# Patient Record
Sex: Male | Born: 1960 | Race: White | Hispanic: No | Marital: Married | Smoking: Never smoker
Health system: Southern US, Community
[De-identification: ages and names within clinical notes are randomized; demographics above are authoritative.]

## PROBLEM LIST (undated history)

## (undated) DIAGNOSIS — D3500 Benign neoplasm of unspecified adrenal gland: Secondary | ICD-10-CM

## (undated) DIAGNOSIS — N402 Nodular prostate without lower urinary tract symptoms: Secondary | ICD-10-CM

## (undated) DIAGNOSIS — I10 Essential (primary) hypertension: Secondary | ICD-10-CM

## (undated) DIAGNOSIS — G4733 Obstructive sleep apnea (adult) (pediatric): Secondary | ICD-10-CM

## (undated) DIAGNOSIS — N200 Calculus of kidney: Secondary | ICD-10-CM

## (undated) HISTORY — PX: KNEE SURGERY: SHX244

---

## 2015-05-17 ENCOUNTER — Emergency Department (HOSPITAL_BASED_OUTPATIENT_CLINIC_OR_DEPARTMENT_OTHER)
Admission: EM | Admit: 2015-05-17 | Discharge: 2015-05-17 | Disposition: A | Discharge status: Home / Self Care | Payer: Worker's Compensation | Attending: Emergency Medicine | Admitting: Emergency Medicine

## 2015-05-17 ENCOUNTER — Emergency Department (HOSPITAL_BASED_OUTPATIENT_CLINIC_OR_DEPARTMENT_OTHER): Payer: Worker's Compensation

## 2015-05-17 ENCOUNTER — Encounter (HOSPITAL_BASED_OUTPATIENT_CLINIC_OR_DEPARTMENT_OTHER): Payer: Self-pay

## 2015-05-17 DIAGNOSIS — Y9289 Other specified places as the place of occurrence of the external cause: Secondary | ICD-10-CM | POA: Insufficient documentation

## 2015-05-17 DIAGNOSIS — Y990 Civilian activity done for income or pay: Secondary | ICD-10-CM | POA: Diagnosis not present

## 2015-05-17 DIAGNOSIS — Y9389 Activity, other specified: Secondary | ICD-10-CM | POA: Diagnosis not present

## 2015-05-17 DIAGNOSIS — S29001A Unspecified injury of muscle and tendon of front wall of thorax, initial encounter: Secondary | ICD-10-CM | POA: Diagnosis not present

## 2015-05-17 DIAGNOSIS — W172XXA Fall into hole, initial encounter: Secondary | ICD-10-CM | POA: Insufficient documentation

## 2015-05-17 DIAGNOSIS — R0781 Pleurodynia: Secondary | ICD-10-CM

## 2015-05-17 DIAGNOSIS — R1011 Right upper quadrant pain: Secondary | ICD-10-CM

## 2015-05-17 DIAGNOSIS — W19XXXA Unspecified fall, initial encounter: Secondary | ICD-10-CM

## 2015-05-17 DIAGNOSIS — N201 Calculus of ureter: Secondary | ICD-10-CM

## 2015-05-17 DIAGNOSIS — E278 Other specified disorders of adrenal gland: Secondary | ICD-10-CM | POA: Diagnosis not present

## 2015-05-17 DIAGNOSIS — R0789 Other chest pain: Secondary | ICD-10-CM

## 2015-05-17 LAB — CBC WITH DIFFERENTIAL/PLATELET
BASOS ABS: 0 10*3/uL (ref 0.0–0.1)
BASOS PCT: 0 %
Eosinophils Absolute: 0.1 10*3/uL (ref 0.0–0.7)
Eosinophils Relative: 1 %
HEMATOCRIT: 45.4 % (ref 39.0–52.0)
HEMOGLOBIN: 15.1 g/dL (ref 13.0–17.0)
Lymphocytes Relative: 20 %
Lymphs Abs: 1.7 10*3/uL (ref 0.7–4.0)
MCH: 28.1 pg (ref 26.0–34.0)
MCHC: 33.3 g/dL (ref 30.0–36.0)
MCV: 84.4 fL (ref 78.0–100.0)
Monocytes Absolute: 0.6 10*3/uL (ref 0.1–1.0)
Monocytes Relative: 7 %
NEUTROS ABS: 6.3 10*3/uL (ref 1.7–7.7)
NEUTROS PCT: 72 %
PLATELETS: 205 10*3/uL (ref 150–400)
RBC: 5.38 MIL/uL (ref 4.22–5.81)
RDW: 13.5 % (ref 11.5–15.5)
WBC: 8.7 10*3/uL (ref 4.0–10.5)

## 2015-05-17 LAB — COMPREHENSIVE METABOLIC PANEL
ALK PHOS: 77 U/L (ref 38–126)
ALT: 35 U/L (ref 17–63)
ANION GAP: 5 (ref 5–15)
AST: 34 U/L (ref 15–41)
Albumin: 4.2 g/dL (ref 3.5–5.0)
BILIRUBIN TOTAL: 0.6 mg/dL (ref 0.3–1.2)
BUN: 16 mg/dL (ref 6–20)
CALCIUM: 8.5 mg/dL — AB (ref 8.9–10.3)
CO2: 28 mmol/L (ref 22–32)
Chloride: 104 mmol/L (ref 101–111)
Creatinine, Ser: 1.21 mg/dL (ref 0.61–1.24)
GFR calc non Af Amer: >60 mL/min (ref >60)
Glucose, Bld: 127 mg/dL — ABNORMAL HIGH (ref 65–99)
POTASSIUM: 4.2 mmol/L (ref 3.5–5.1)
Sodium: 137 mmol/L (ref 135–145)
TOTAL PROTEIN: 6.9 g/dL (ref 6.5–8.1)

## 2015-05-17 MED ORDER — OXYCODONE-ACETAMINOPHEN 5-325 MG PO TABS: 1.0000 | ORAL_TABLET | Freq: Three times a day (TID) | ORAL | Status: AC | PRN

## 2015-05-17 MED ORDER — OXYCODONE-ACETAMINOPHEN 5-325 MG PO TABS
2.0000 | ORAL_TABLET | Freq: Once | ORAL | Status: AC
  Administered 2015-05-17: 2 via ORAL
  Filled 2015-05-17: qty 2

## 2015-05-17 MED ORDER — IOHEXOL 300 MG/ML  SOLN
100.0000 mL | Freq: Once | INTRAMUSCULAR | Status: AC | PRN
  Administered 2015-05-17: 100 mL via INTRAVENOUS

## 2015-05-17 NOTE — Discharge Instructions (Signed)
Chest Wall Pain Chest wall pain is pain in or around the bones and muscles of your chest. Sometimes, an injury causes this pain. Sometimes, the cause may not be known. This pain may take several weeks or longer to get better. HOME CARE INSTRUCTIONS  Pay attention to any changes in your symptoms. Take these actions to help with your pain:   Rest as told by your health care provider.   Avoid activities that cause pain. These include any activities that use your chest muscles or your abdominal and side muscles to lift heavy items.   If directed, apply ice to the painful area:  Put ice in a plastic bag.  Place a towel between your skin and the bag.  Leave the ice on for 20 minutes, 2-3 times per day.  Take over-the-counter and prescription medicines only as told by your health care provider.  Do not use tobacco products, including cigarettes, chewing tobacco, and e-cigarettes. If you need help quitting, ask your health care provider.  Keep all follow-up visits as told by your health care provider. This is important. SEEK MEDICAL CARE IF:  You have a fever.  Your chest pain becomes worse.  You have new symptoms. SEEK IMMEDIATE MEDICAL CARE IF:  You have nausea or vomiting.  You feel sweaty or light-headed.  You have a cough with phlegm (sputum) or you cough up blood.  You develop shortness of breath.   This information is not intended to replace advice given to you by your health care provider. Make sure you discuss any questions you have with your health care provider.   Document Released: 06/04/2005 Document Revised: 02/23/2015 Document Reviewed: 08/30/2014 Elsevier Interactive Patient Education 2016 [place].  Kidney Stones Kidney stones (urolithiasis) are deposits that form inside your kidneys. The intense pain is caused by the stone moving through the urinary tract. When the stone moves, the ureter goes into spasm around the stone. The stone is usually passed in  the urine.  CAUSES   A disorder that makes certain neck glands produce too much parathyroid hormone (primary hyperparathyroidism).  A buildup of uric acid crystals, similar to gout in your joints.  Narrowing (stricture) of the ureter.  A kidney obstruction present at birth (congenital obstruction).  Previous surgery on the kidney or ureters.  Numerous kidney infections. SYMPTOMS   Feeling sick to your stomach (nauseous).  Throwing up (vomiting).  Blood in the urine (hematuria).  Pain that usually spreads (radiates) to the groin.  Frequency or urgency of urination. DIAGNOSIS   Taking a history and physical exam.  Blood or urine tests.  CT scan.  Occasionally, an examination of the inside of the urinary bladder (cystoscopy) is performed. TREATMENT   Observation.  Increasing your fluid intake.  Extracorporeal shock wave lithotripsy--This is a noninvasive procedure that uses shock waves to break up kidney stones.  Surgery may be needed if you have severe pain or persistent obstruction. There are various surgical procedures. Most of the procedures are performed with the use of small instruments. Only small incisions are needed to accommodate these instruments, so recovery time is minimized. The size, location, and chemical composition are all important variables that will determine the proper choice of action for you. Talk to your health care provider to better understand your situation so that you will minimize the risk of injury to yourself and your kidney.  HOME CARE INSTRUCTIONS   Drink enough water and fluids to keep your urine clear or pale yellow. This will  help you to pass the stone or stone fragments.  Strain all urine through the provided strainer. Keep all particulate matter and stones for your health care provider to see. The stone causing the pain may be as small as a grain of salt. It is very important to use the strainer each and every time you pass your  urine. The collection of your stone will allow your health care provider to analyze it and verify that a stone has actually passed. The stone analysis will often identify what you can do to reduce the incidence of recurrences.  Only take over-the-counter or prescription medicines for pain, discomfort, or fever as directed by your health care provider.  Keep all follow-up visits as told by your health care provider. This is important.  Get follow-up X-rays if required. The absence of pain does not always mean that the stone has passed. It may have only stopped moving. If the urine remains completely obstructed, it can cause loss of kidney function or even complete destruction of the kidney. It is your responsibility to make sure X-rays and follow-ups are completed. Ultrasounds of the kidney can show blockages and the status of the kidney. Ultrasounds are not associated with any radiation and can be performed easily in a matter of minutes.  Make changes to your daily diet as told by your health care provider. You may be told to:  Limit the amount of salt that you eat.  Eat 5 or more servings of fruits and vegetables each day.  Limit the amount of meat, poultry, fish, and eggs that you eat.  Collect a 24-hour urine sample as told by your health care provider.You may need to collect another urine sample every 6-12 months. SEEK MEDICAL CARE IF:  You experience pain that is progressive and unresponsive to any pain medicine you have been prescribed. SEEK IMMEDIATE MEDICAL CARE IF:   Pain cannot be controlled with the prescribed medicine.  You have a fever or shaking chills.  The severity or intensity of pain increases over 18 hours and is not relieved by pain medicine.  You develop a new onset of abdominal pain.  You feel faint or pass out.  You are unable to urinate.   This information is not intended to replace advice given to you by your health care provider. Make sure you discuss any  questions you have with your health care provider.   Document Released: 06/04/2005 Document Revised: 02/23/2015 Document Reviewed: 11/05/2012 Elsevier Interactive Patient Education [employer].

## 2015-05-17 NOTE — ED Provider Notes (Signed)
CSN: [id number]     Arrival date & time 05/17/15  1353 History   First MD Initiated Contact with Patient 05/17/15 1411     Chief Complaint  Patient presents with  . Fall     (Consider location/radiation/quality/duration/timing/severity/associated sxs/prior Treatment) HPI Comments: Pt states that he feel in a whole and the landed on his right side. Denies loc. States that it hurts to take a deep breath  Patient is a 54 y.o. male presenting with fall. The history is provided by the patient. No language interpreter was used.  Fall This is a new problem. The current episode started today. The problem occurs constantly. The problem has been unchanged. Associated symptoms include chest pain. Pertinent negatives include no abdominal pain, neck pain, numbness or weakness. The symptoms are aggravated by bending. He has tried nothing for the symptoms.    History reviewed. No pertinent past medical history. Past Surgical History  Procedure Laterality Date  . Knee surgery     No family history on file. Social History  Substance Use Topics  . Smoking status: Never Smoker   . Smokeless tobacco: None  . Alcohol Use: No    Review of Systems  Cardiovascular: Positive for chest pain.  Gastrointestinal: Negative for abdominal pain.  Musculoskeletal: Negative for neck pain.  Neurological: Negative for weakness and numbness.  All other systems reviewed and are negative.     Allergies  Sulfa antibiotics  Home Medications   Prior to Admission medications   Medication Sig Start Date End Date Taking? Authorizing Provider  Cetirizine HCl (ZYRTEC ALLERGY PO) Take by mouth.   Yes Historical Provider, MD   BP 188/109 mmHg  Pulse 132  Temp(Src) 98.2 F (36.8 C) (Oral)  Resp 20  Ht 5\' 3"  (1.6 m)  Wt 115.667 kg  BMI 45.18 kg/m2  SpO2 98% Physical Exam  Constitutional: He is oriented to person, place, and time. He appears well-developed and well-nourished.  HENT:  Head: Normocephalic and  atraumatic.  Cardiovascular: Normal rate and regular rhythm.   Pulmonary/Chest: Effort normal and breath sounds normal.  Right anterior and lateral ribs tender to palpation  Abdominal: Soft. Bowel sounds are normal. There is no tenderness.  Musculoskeletal: Normal range of motion.  Neurological: He is alert and oriented to person, place, and time. He exhibits normal muscle tone. Coordination normal.  Skin: Skin is warm and dry.  Psychiatric: He has a normal mood and affect.  Nursing note and vitals reviewed.   ED Course  Procedures (including critical care time) Labs Review Labs Reviewed - No data to display  Imaging Review Dg Ribs Unilateral W/chest Right  05/17/2015  CLINICAL DATA:  [place] one hour ago. Pain in rt lower ant ribs to rt posterior ribs. EXAM: RIGHT RIBS AND CHEST - 3+ VIEW COMPARISON:  None. FINDINGS: No fracture or other bone lesions are seen involving the ribs. There is no evidence of pneumothorax or pleural effusion. Both lungs are clear. Heart size and mediastinal contours are within normal limits. IMPRESSION: Negative. Electronically Signed   By: Skipper Cliche M.D.   On: 05/17/2015 14:32   Ct Abdomen Pelvis W Contrast  05/17/2015  CLINICAL DATA:  54 year old male with history of trauma from a fall into a hole while at work complaining of pain in the right flank. EXAM: CT ABDOMEN AND PELVIS WITH CONTRAST TECHNIQUE: Multidetector CT imaging of the abdomen and pelvis was performed using the standard protocol following bolus administration of intravenous contrast. CONTRAST:  160mL OMNIPAQUE IOHEXOL 300  MG/ML  SOLN COMPARISON:  No priors. FINDINGS: Lower chest:  Unremarkable. Hepatobiliary: No definite cystic or solid hepatic lesions. No intra or extrahepatic biliary ductal dilatation. No signs of acute traumatic injury to the liver. Gallbladder is normal in appearance. Pancreas: No pancreatic mass. No pancreatic ductal dilatation. No pancreatic or peripancreatic fluid or  inflammatory changes. Spleen: No evidence of acute traumatic injury to the spleen. Adrenals/Urinary Tract: No signs of acute traumatic injury to either kidney. 6 mm nonobstructive calculus in the upper pole collecting system of the left kidney. In addition, at the right ureteropelvic junction there is a 7 mm calculus. There is some very mild fullness in the right renal collecting system, with slightly delayed nephrogram and delayed excretion of contrast by the right kidney, indicative of mild obstruction at this time. Kidneys are otherwise unremarkable in appearance. Urinary bladder is normal in appearance. 2.2 x 1.7 cm left adrenal nodule is indeterminate. Right adrenal gland is normal in appearance. Stomach/Bowel: The appearance of the stomach is normal. No pathologic dilatation of small bowel or colon. Normal appendix. Vascular/Lymphatic: No significant atherosclerotic disease, aneurysm or dissection identified in the abdominal or pelvic vasculature. Retroaortic left renal vein (normal anatomical variant) incidentally noted. No lymphadenopathy noted in the abdomen or pelvis. Reproductive: Prostate gland and seminal vesicles are unremarkable in appearance. Other: Tiny umbilical hernia containing only omental fat incidentally noted. No significant volume of ascites. No pneumoperitoneum. Musculoskeletal: No acute displaced fractures or aggressive appearing lytic or blastic lesions are noted in the visualized portions of the skeleton. IMPRESSION: 1. No signs of significant acute traumatic injury to the abdomen or pelvis. 2. 7 mm calculus at the right ureteropelvic junction, with signs of mild right-sided obstruction. Urologic consultation is suggested. 3. 6 mm nonobstructive calculus in the upper pole collecting system of the left kidney. 4. 2.2 x 1.7 cm indeterminate nodule in the left adrenal gland. 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 This could be further characterized with noncontrast CT or  MRI of the abdomen if clinically appropriate. 5. Tiny umbilical hernia containing only omental fat. 6. Normal appendix. Electronically Signed   By: Vinnie Langton M.D.   On: 05/17/2015 15:55   I have personally reviewed and evaluated these images and lab results as part of my medical decision-making.   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)  Right upper quadrant pain  Ureteral stone    Discussed findings of ct scan with pt. Pt given urology follow up. Will given oxycodone for pain. No acute abdominal or chest injury noted    Glendell Docker, NP 05/17/15 [place], MD 05/18/15 [id number]

## 2015-05-17 NOTE — ED Notes (Signed)
Fell in a hole at [employer] to right flank-steady gait-standing thru triage

## 2016-05-06 IMAGING — CR DG RIBS W/ CHEST 3+V*R*
4 series · 4 of 4 positions shown · non-contrast
Comparison: None.

CLINICAL DATA: Fell one hour ago. Pain in rt lower ant ribs to rt
posterior ribs.

EXAM:
RIGHT RIBS AND CHEST - 3+ VIEW

[w chest pa]
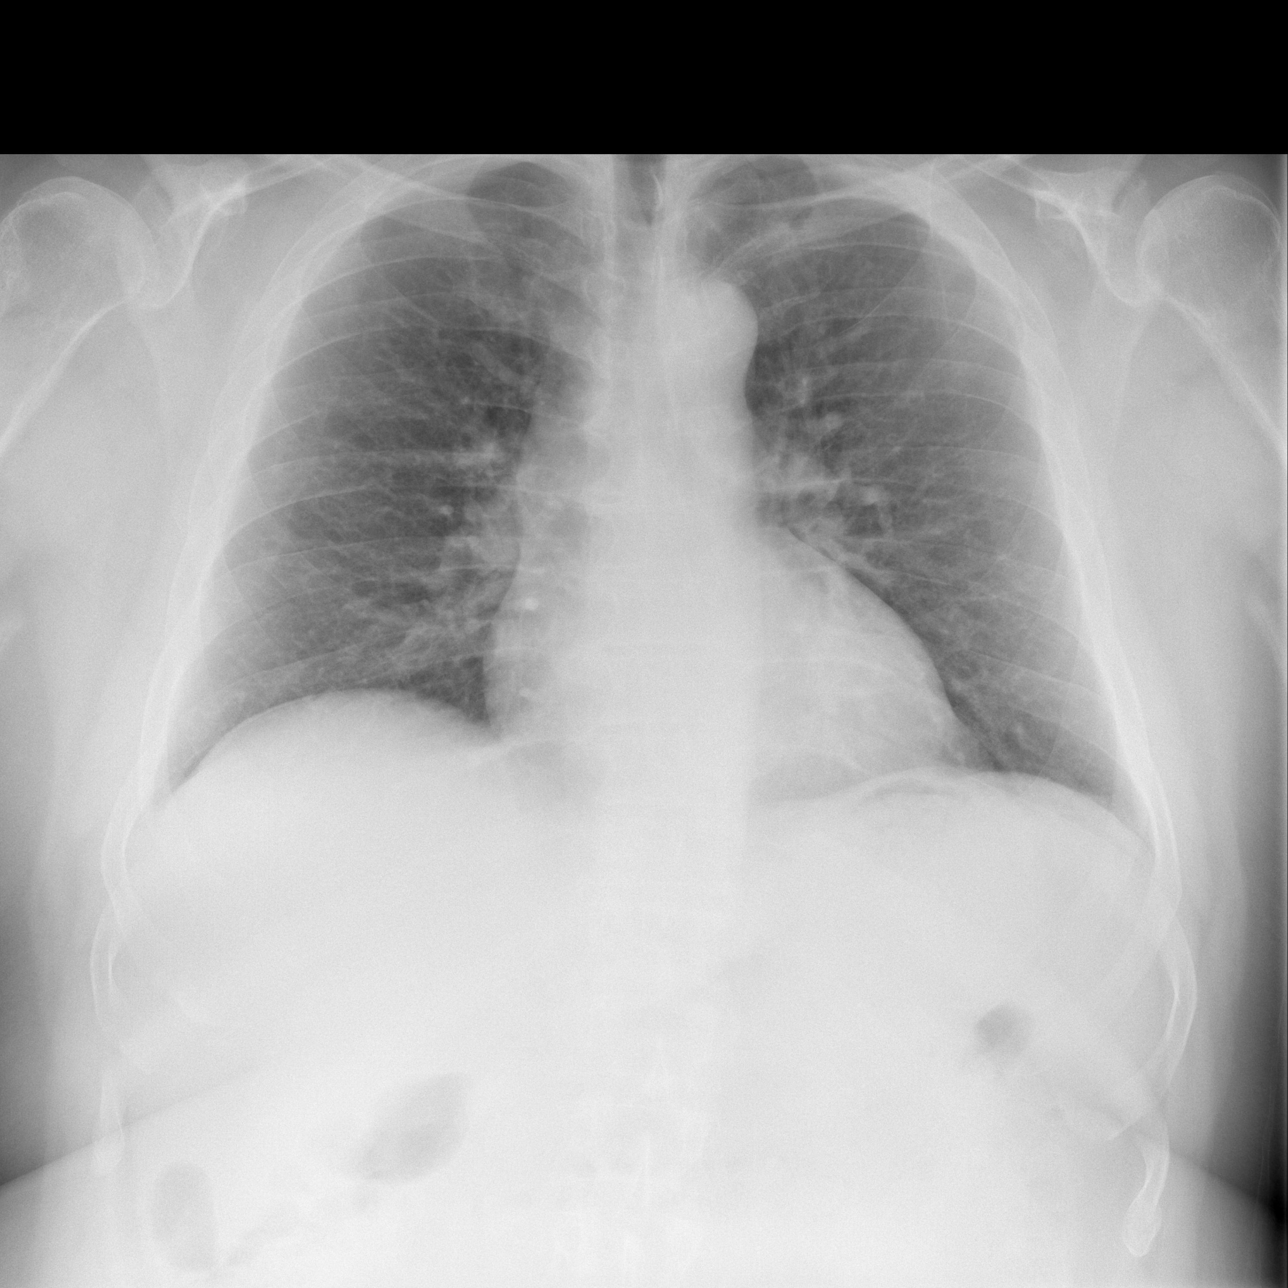

[w ribs ap/pa upper right *]
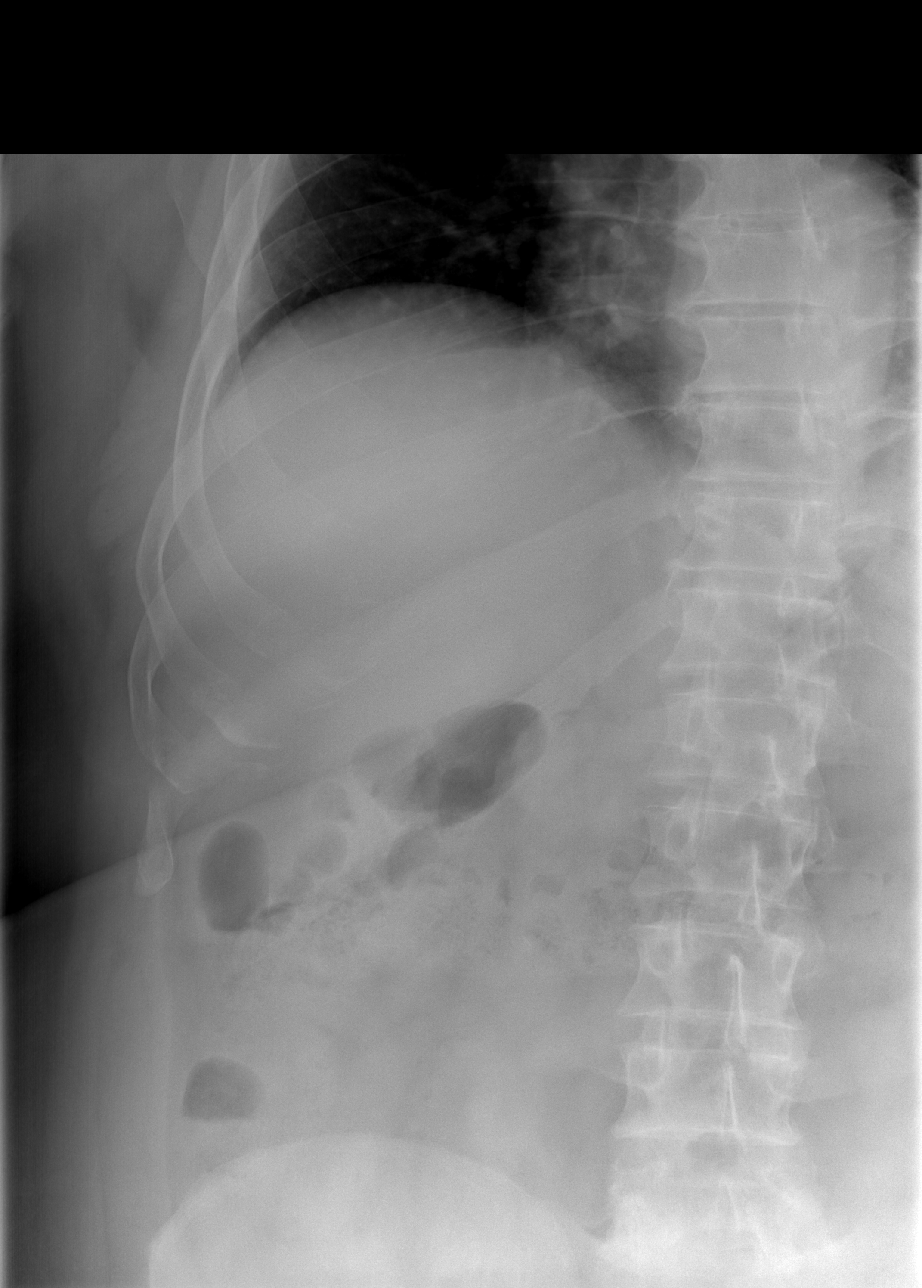

[w ribs oblique right *]
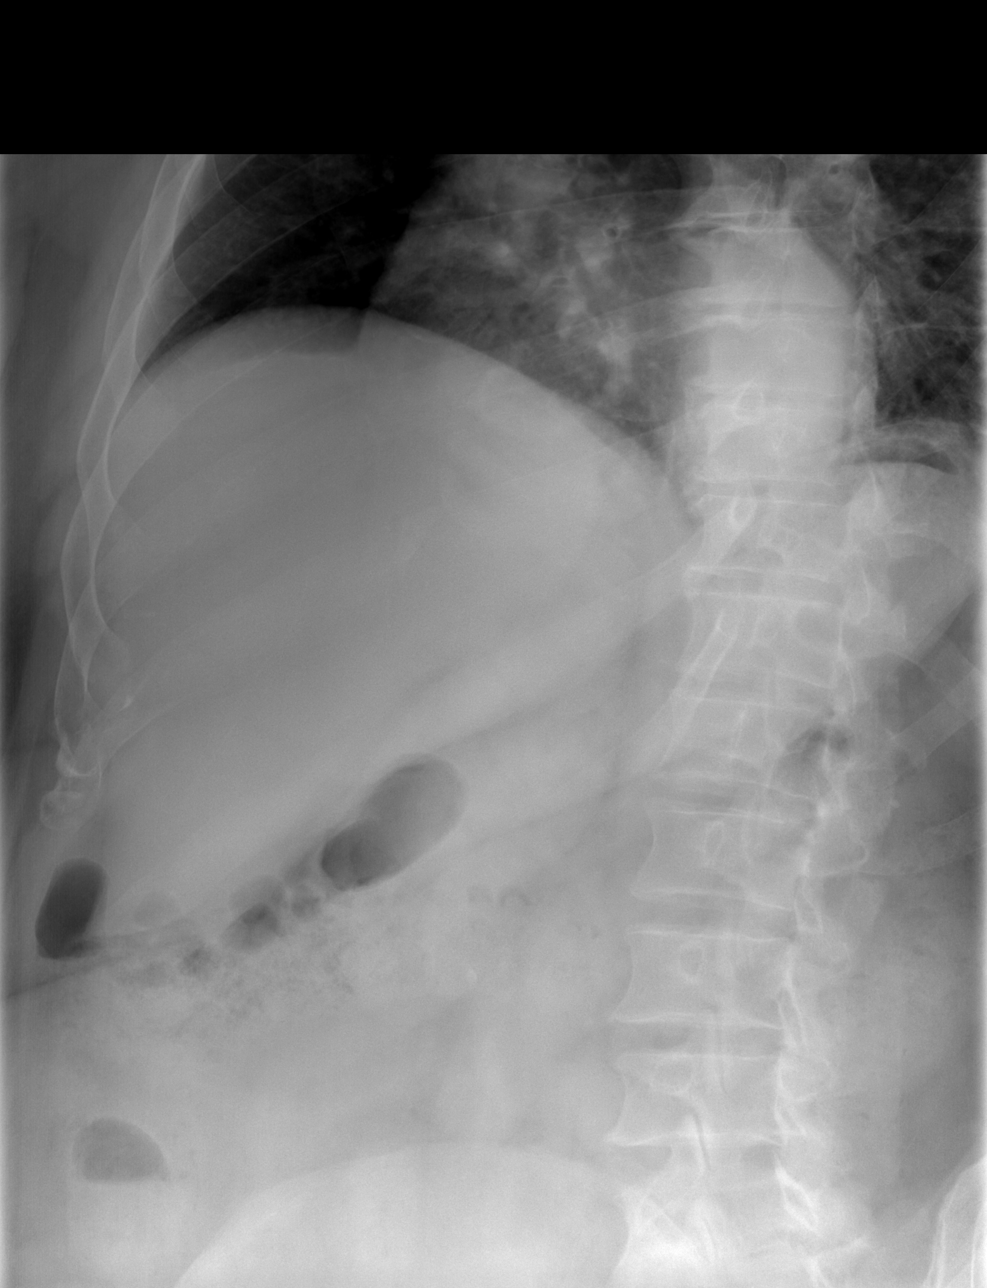

[w ribs oblique right]
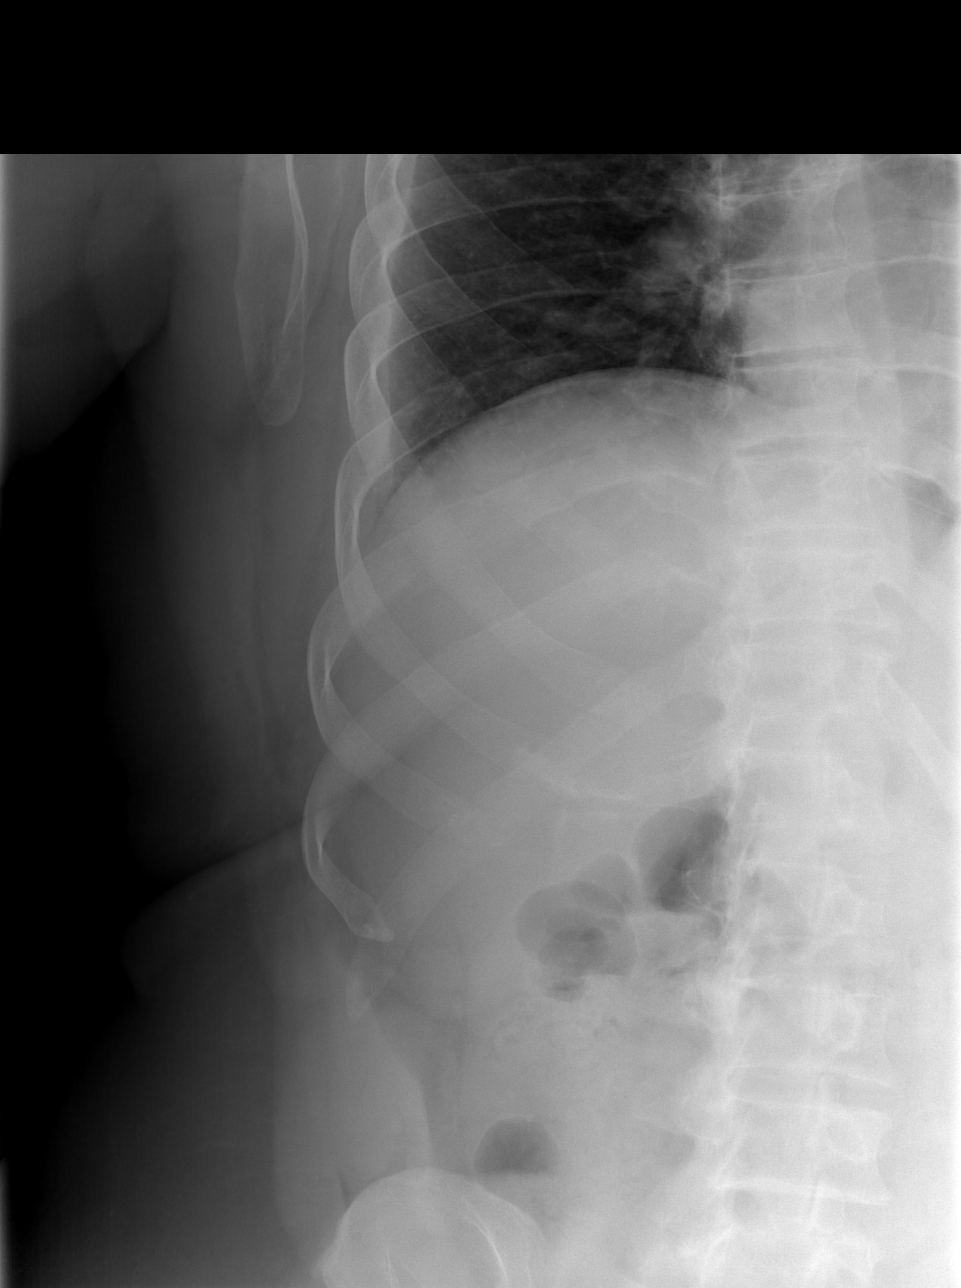

[4 of 4 positions shown; findings below may reference images not displayed]

FINDINGS: No fracture or other bone lesions are seen involving the ribs. There
is no evidence of pneumothorax or pleural effusion. Both lungs are
clear. Heart size and mediastinal contours are within normal limits.
IMPRESSION: Negative.

## 2020-02-01 ENCOUNTER — Other Ambulatory Visit: Payer: Self-pay

## 2020-02-01 ENCOUNTER — Emergency Department (INDEPENDENT_AMBULATORY_CARE_PROVIDER_SITE_OTHER)
Admission: RE | Admit: 2020-02-01 | Discharge: 2020-02-01 | Disposition: A | Discharge status: Home / Self Care | Payer: 59 | Source: Ambulatory Visit | Attending: Family Medicine | Admitting: Family Medicine

## 2020-02-01 VITALS — BP 151/80 | HR 76 | Temp 98.3°F | Resp 16

## 2020-02-01 DIAGNOSIS — T63463A Toxic effect of venom of wasps, assault, initial encounter: Secondary | ICD-10-CM

## 2020-02-01 DIAGNOSIS — W57XXXA Bitten or stung by nonvenomous insect and other nonvenomous arthropods, initial encounter: Secondary | ICD-10-CM

## 2020-02-01 MED ORDER — PREDNISONE 20 MG PO TABS: ORAL_TABLET | ORAL | 0 refills | Status: AC

## 2020-02-01 MED ORDER — METHYLPREDNISOLONE SODIUM SUCC 125 MG IJ SOLR
80.0000 mg | Freq: Once | INTRAMUSCULAR | Status: AC
  Administered 2020-02-01: 80 mg via INTRAMUSCULAR

## 2020-02-01 MED ORDER — DOXYCYCLINE HYCLATE 100 MG PO CAPS: ORAL_CAPSULE | ORAL | 0 refills | Status: AC

## 2020-02-01 NOTE — ED Triage Notes (Signed)
Patient presents to Urgent Care with complaints of bee stings to bilateral lower legs since two days ago. Patient reports he has had bad reactions when he was stung by wasps in the past, these were yellow jackets. No respiratory involvement, swelling and redness noted to lower legs.

## 2020-02-01 NOTE — Discharge Instructions (Addendum)
Begin prednisone Tuesday 02/02/20. Continue Zyrtec for itching and rash.  May take Benadryl at bedtime if needed.

## 2020-02-01 NOTE — ED Provider Notes (Signed)
[facility]    CSN: [id number] Arrival date & time: 02/01/20  1540      History   Chief Complaint Chief Complaint  Patient presents with  . Appointment  . Insect Bite    HPI Matthew Townsend is a 59 y.o. male.   Patient was stung by several bees on both lower legs two days ago.  He denies difficulty swallowing, wheezing, shortness of breath, etc.  The sting sites have become more erythematous and swollen.  He denies fever.  The history is provided by the patient.    History reviewed. No pertinent past medical history.  There are no problems to display for this patient.   Past Surgical History:  Procedure Laterality Date  . KNEE SURGERY         Home Medications    Prior to Admission medications   Medication Sig Start Date End Date Taking? Authorizing Provider  candesartan (ATACAND) 16 MG tablet Take 16 mg by mouth daily.   Yes [provider]  hydrochlorothiazide (HYDRODIURIL) 12.5 MG tablet Take 25 mg by mouth daily.    Yes [provider]  Cetirizine HCl (ZYRTEC ALLERGY PO) Take by mouth.    [provider]  doxycycline (VIBRAMYCIN) 100 MG capsule Take one cap PO Q12hr with food. 02/01/20   Kandra Nicolas, MD  oxyCODONE-acetaminophen (PERCOCET/ROXICET) 5-325 MG tablet Take 1-2 tablets by mouth every 8 (eight) hours as needed for severe pain. 05/17/15   Glendell Docker, NP  predniSONE (DELTASONE) 20 MG tablet Take one tab by mouth twice daily for 4 days, then one daily for 3 days. Take with food. 02/01/20   Kandra Nicolas, MD    Family History Family History  Problem Relation Age of Onset  . Diabetes Mother     Social History Social History   Tobacco Use  . Smoking status: Never Smoker  . Smokeless tobacco: Never Used  Substance Use Topics  . Alcohol use: No  . Drug use: No     Allergies   Sulfa antibiotics and Formaldehyde   Review of Systems Review of Systems  Constitutional: Negative for activity  change, appetite change, chills, diaphoresis, fatigue and fever.  Respiratory: Negative for chest tightness, shortness of breath and wheezing.   Skin: Positive for color change and wound.  All other systems reviewed and are negative.    Physical Exam Triage Vital Signs ED Triage Vitals  Enc Vitals Group     BP 02/01/20 1629 (!) 151/80     Pulse Rate 02/01/20 1629 76     Resp 02/01/20 1629 16     Temp 02/01/20 1629 98.3 F (36.8 C)     Temp Source 02/01/20 1629 Oral     SpO2 02/01/20 1629 99 %     Weight --      Height --      Head Circumference --      Peak Flow --      Pain Score 02/01/20 1625 0     Pain Loc --      Pain Edu? --      Excl. in [place]? --    No data found.  Updated Vital Signs BP (!) 151/80 (BP Location: Right Arm)   Pulse 76   Temp 98.3 F (36.8 C) (Oral)   Resp 16   SpO2 99%   Visual Acuity Right Eye Distance:   Left Eye Distance:   Bilateral Distance:    Right Eye Near:   Left Eye Near:  Bilateral Near:     Physical Exam Vitals and nursing note reviewed.  Constitutional:      General: He is not in acute distress. HENT:     Head: Atraumatic.     Mouth/Throat:     Pharynx: Oropharynx is clear.  Eyes:     Pupils: Pupils are equal, round, and reactive to light.  Cardiovascular:     Rate and Rhythm: Normal rate.  Pulmonary:     Effort: Pulmonary effort is normal.  Musculoskeletal:        General: No tenderness.     Right lower leg: No edema.     Left lower leg: No edema.       Legs:     Comments: Lower legs have several macular erythematous areas with mild swelling as noted on diagram.  Minimal tenderness to palpation.  Lymphadenopathy:     Cervical: No cervical adenopathy.  Skin:    General: Skin is warm and dry.  Neurological:     Mental Status: He is alert.      UC Treatments / Results  Labs (all labs ordered are listed, but only abnormal results are displayed) Labs Reviewed - No data to display  EKG   Radiology No  results found.  Procedures Procedures (including critical care time)  Medications Ordered in UC Medications  methylPREDNISolone sodium succinate (SOLU-MEDROL) 125 mg/2 mL injection 80 mg (has no administration in time range)    Initial Impression / Assessment and Plan / UC Course  I have reviewed the triage vital signs and the nursing notes.  Pertinent labs & imaging results that were available during my care of the patient were reviewed by me and considered in my medical decision making (see chart for details).    Administered Solumedrol 80mg  IM, then begin prednisone burst/taper. Begin doxycycline for staph coverage. Followup with Family Doctor if not improved in one week.    Final Clinical Impressions(s) / UC Diagnoses   Final diagnoses:  Yellow jacket sting, assault, initial encounter  Infected insect bite or sting     Discharge Instructions     Begin prednisone Tuesday 02/02/20. Continue Zyrtec for itching and rash.  May take Benadryl at bedtime if needed.    ED Prescriptions    Medication Sig Dispense Auth. Provider   doxycycline (VIBRAMYCIN) 100 MG capsule Take one cap PO Q12hr with food. 14 capsule Kandra Nicolas, MD   predniSONE (DELTASONE) 20 MG tablet Take one tab by mouth twice daily for 4 days, then one daily for 3 days. Take with food. 11 tablet Kandra Nicolas, MD        Kandra Nicolas, MD 02/08/20 [phone]

## 2022-06-05 ENCOUNTER — Encounter (HOSPITAL_BASED_OUTPATIENT_CLINIC_OR_DEPARTMENT_OTHER): Payer: Self-pay | Admitting: Emergency Medicine

## 2022-06-05 ENCOUNTER — Emergency Department (HOSPITAL_BASED_OUTPATIENT_CLINIC_OR_DEPARTMENT_OTHER)
Admission: EM | Admit: 2022-06-05 | Discharge: 2022-06-05 | Disposition: A | Discharge status: Home / Self Care | Payer: 59 | Attending: Emergency Medicine | Admitting: Emergency Medicine

## 2022-06-05 ENCOUNTER — Emergency Department (HOSPITAL_BASED_OUTPATIENT_CLINIC_OR_DEPARTMENT_OTHER): Payer: 59

## 2022-06-05 ENCOUNTER — Other Ambulatory Visit: Payer: Self-pay

## 2022-06-05 DIAGNOSIS — I10 Essential (primary) hypertension: Secondary | ICD-10-CM | POA: Insufficient documentation

## 2022-06-05 DIAGNOSIS — K8000 Calculus of gallbladder with acute cholecystitis without obstruction: Secondary | ICD-10-CM

## 2022-06-05 DIAGNOSIS — R1011 Right upper quadrant pain: Secondary | ICD-10-CM

## 2022-06-05 DIAGNOSIS — K8050 Calculus of bile duct without cholangitis or cholecystitis without obstruction: Secondary | ICD-10-CM

## 2022-06-05 DIAGNOSIS — K8020 Calculus of gallbladder without cholecystitis without obstruction: Secondary | ICD-10-CM | POA: Diagnosis not present

## 2022-06-05 DIAGNOSIS — Z79899 Other long term (current) drug therapy: Secondary | ICD-10-CM | POA: Insufficient documentation

## 2022-06-05 DIAGNOSIS — D72829 Elevated white blood cell count, unspecified: Secondary | ICD-10-CM | POA: Insufficient documentation

## 2022-06-05 HISTORY — DX: Essential (primary) hypertension: I10

## 2022-06-05 HISTORY — DX: Nodular prostate without lower urinary tract symptoms: N40.2

## 2022-06-05 HISTORY — DX: Benign neoplasm of unspecified adrenal gland: D35.00

## 2022-06-05 HISTORY — DX: Obstructive sleep apnea (adult) (pediatric): G47.33

## 2022-06-05 HISTORY — DX: Calculus of kidney: N20.0

## 2022-06-05 LAB — CBC
HCT: 48.7 % (ref 39.0–52.0)
Hemoglobin: 16.8 g/dL (ref 13.0–17.0)
MCH: 28.4 pg (ref 26.0–34.0)
MCHC: 34.5 g/dL (ref 30.0–36.0)
MCV: 82.4 fL (ref 80.0–100.0)
Platelets: 229 10*3/uL (ref 150–400)
RBC: 5.91 MIL/uL — ABNORMAL HIGH (ref 4.22–5.81)
RDW: 12.8 % (ref 11.5–15.5)
WBC: 13.1 10*3/uL — ABNORMAL HIGH (ref 4.0–10.5)
nRBC: 0 % (ref 0.0–0.2)

## 2022-06-05 LAB — BASIC METABOLIC PANEL
Anion gap: 11 (ref 5–15)
BUN: 15 mg/dL (ref 6–20)
CO2: 25 mmol/L (ref 22–32)
Calcium: 9.9 mg/dL (ref 8.9–10.3)
Chloride: 101 mmol/L (ref 98–111)
Creatinine, Ser: 0.88 mg/dL (ref 0.61–1.24)
GFR, Estimated: >60 mL/min (ref >60)
Glucose, Bld: 153 mg/dL — ABNORMAL HIGH (ref 70–99)
Potassium: 3.7 mmol/L (ref 3.5–5.1)
Sodium: 137 mmol/L (ref 135–145)

## 2022-06-05 LAB — HEPATIC FUNCTION PANEL
ALT: 25 U/L (ref 0–44)
AST: 24 U/L (ref 15–41)
Albumin: 4.7 g/dL (ref 3.5–5.0)
Alkaline Phosphatase: 78 U/L (ref 38–126)
Bilirubin, Direct: 0.2 mg/dL (ref 0.0–0.2)
Indirect Bilirubin: 1 mg/dL — ABNORMAL HIGH (ref 0.3–0.9)
Total Bilirubin: 1.2 mg/dL (ref 0.3–1.2)
Total Protein: 8.1 g/dL (ref 6.5–8.1)

## 2022-06-05 LAB — LIPASE, BLOOD: Lipase: 32 U/L (ref 11–51)

## 2022-06-05 LAB — TROPONIN I (HIGH SENSITIVITY)
Troponin I (High Sensitivity): 9 ng/L (ref <18)
Troponin I (High Sensitivity): 9 ng/L (ref <18)

## 2022-06-05 MED ORDER — HYDROMORPHONE HCL 1 MG/ML IJ SOLN
1.0000 mg | Freq: Once | INTRAMUSCULAR | Status: AC
  Administered 2022-06-05: 1 mg via INTRAVENOUS
  Filled 2022-06-05: qty 1

## 2022-06-05 MED ORDER — ONDANSETRON HCL 4 MG/2ML IJ SOLN
4.0000 mg | Freq: Once | INTRAMUSCULAR | Status: AC
  Administered 2022-06-05: 4 mg via INTRAVENOUS
  Filled 2022-06-05: qty 2

## 2022-06-05 MED ORDER — SODIUM CHLORIDE 0.9 % IV SOLN: INTRAVENOUS | Status: DC

## 2022-06-05 MED ORDER — HYDROCODONE-ACETAMINOPHEN 5-325 MG PO TABS: 1.0000 | ORAL_TABLET | Freq: Four times a day (QID) | ORAL | 0 refills | Status: AC | PRN

## 2022-06-05 NOTE — ED Triage Notes (Signed)
Pt reports gassy feeling in epigastric area, radiating to mid chest and now to back since Fri; one episode of nausea today; sts feels like he can't get into a comfortable position

## 2022-06-05 NOTE — ED Provider Notes (Addendum)
[facility] Provider Note   CSN: [id number] Arrival date & time: 06/05/22  1344     History  Chief Complaint  Patient presents with   Chest Pain    Matthew Townsend is a 61 y.o. male.  Patient with a complaint of epigastric right upper quadrant abdominal pain that radiates to his right shoulder area since Friday evening.  Has waxed and waned but has been constantly there.  Had 1 episode of nausea today.  Patient not able to feel like he is getting into a comfortable position.  Patient in the past has been told he has gallstones and it was recommended to have his gallbladder removed but he opted not to have that done.  He says that pain was a little bit more to the right side but somewhat similar.  Past medical history significant for kidney stones and hypertension.  Patient has never used tobacco products.       Home Medications Prior to Admission medications   Medication Sig Start Date End Date Taking? Authorizing Provider  candesartan (ATACAND) 16 MG tablet Take 16 mg by mouth daily.    [provider]  Cetirizine HCl (ZYRTEC ALLERGY PO) Take by mouth.    [provider]  doxycycline (VIBRAMYCIN) 100 MG capsule Take one cap PO Q12hr with food. 02/01/20   Kandra Nicolas, MD  hydrochlorothiazide (HYDRODIURIL) 12.5 MG tablet Take 25 mg by mouth daily.     [provider]  oxyCODONE-acetaminophen (PERCOCET/ROXICET) 5-325 MG tablet Take 1-2 tablets by mouth every 8 (eight) hours as needed for severe pain. 05/17/15   Glendell Docker, NP  predniSONE (DELTASONE) 20 MG tablet Take one tab by mouth twice daily for 4 days, then one daily for 3 days. Take with food. 02/01/20   Kandra Nicolas, MD      Allergies    Sulfa antibiotics and Formaldehyde    Review of Systems   Review of Systems  Constitutional:  Negative for chills and fever.  HENT:  Negative for rhinorrhea and sore throat.   Eyes:  Negative for visual disturbance.   Respiratory:  Negative for cough and shortness of breath.   Cardiovascular:  Positive for chest pain. Negative for leg swelling.  Gastrointestinal:  Positive for abdominal pain and nausea. Negative for diarrhea and vomiting.  Genitourinary:  Negative for dysuria.  Musculoskeletal:  Negative for back pain and neck pain.  Skin:  Negative for rash.  Neurological:  Negative for dizziness, light-headedness and headaches.  Hematological:  Does not bruise/bleed easily.  Psychiatric/Behavioral:  Negative for confusion.     Physical Exam Updated Vital Signs BP (!) 141/91 (BP Location: Left Arm)   Pulse 81   Temp 97.8 F (36.6 C)   Resp (!) 24   Ht 1.6 m ('5\' 3"'$ )   Wt 115.2 kg   SpO2 97%   BMI 44.99 kg/m  Physical Exam Vitals and nursing note reviewed.  Constitutional:      General: He is not in acute distress.    Appearance: He is well-developed. He is obese. He is not ill-appearing.  HENT:     Head: Normocephalic and atraumatic.  Eyes:     Conjunctiva/sclera: Conjunctivae normal.  Cardiovascular:     Rate and Rhythm: Normal rate and regular rhythm.     Heart sounds: No murmur heard. Pulmonary:     Effort: Pulmonary effort is normal. No respiratory distress.     Breath sounds: Normal breath sounds. No decreased breath sounds, wheezing,  rhonchi or rales.  Chest:     Chest wall: No tenderness.  Abdominal:     Palpations: Abdomen is soft.     Tenderness: There is no abdominal tenderness. There is no guarding.     Comments: No tenderness to palpation epigastric or right upper quadrant part of the abdomen.  Musculoskeletal:        General: No swelling.     Cervical back: Neck supple.     Right lower leg: No edema.     Left lower leg: No edema.  Skin:    General: Skin is warm and dry.     Capillary Refill: Capillary refill takes less than 2 seconds.  Neurological:     General: No focal deficit present.     Mental Status: He is alert and oriented to person, place, and time.   Psychiatric:        Mood and Affect: Mood normal.     ED Results / Procedures / Treatments   Labs (all labs ordered are listed, but only abnormal results are displayed) Labs Reviewed  BASIC METABOLIC PANEL - Abnormal; Notable for the following components:      Result Value   Glucose, Bld 153 (*)    All other components within normal limits  CBC - Abnormal; Notable for the following components:   WBC 13.1 (*)    RBC 5.91 (*)    All other components within normal limits  LIPASE, BLOOD  HEPATIC FUNCTION PANEL  TROPONIN I (HIGH SENSITIVITY)  TROPONIN I (HIGH SENSITIVITY)    EKG EKG Interpretation  Date/Time:  Tuesday June 05 2022 13:54:28 EST Ventricular Rate:  81 PR Interval:  174 QRS Duration: 96 QT Interval:  378 QTC Calculation: 439 R Axis:   264 Text Interpretation: Normal sinus rhythm Inferior infarct , age undetermined Anterolateral infarct , age undetermined Abnormal ECG No previous ECGs available Confirmed by Fredia Sorrow [phone]) on 06/05/2022 5:04:57 PM  Radiology DG Chest 2 View  Result Date: 06/05/2022 CLINICAL DATA:  Chest pain EXAM: CHEST - 2 VIEW COMPARISON:  Chest 05/17/2015 FINDINGS: The heart size and mediastinal contours are within normal limits. Both lungs are clear. The visualized skeletal structures are unremarkable. IMPRESSION: No active cardiopulmonary disease. Electronically Signed   By: Franchot Gallo M.D.   On: 06/05/2022 14:19    Procedures Procedures    Medications Ordered in ED Medications  0.9 %  sodium chloride infusion (has no administration in time range)  ondansetron (ZOFRAN) injection 4 mg (has no administration in time range)  HYDROmorphone (DILAUDID) injection 1 mg (has no administration in time range)    ED Course/ Medical Decision Making/ A&P                           Medical Decision Making Amount and/or Complexity of Data Reviewed Labs: ordered. Radiology: ordered.  Risk Prescription drug  management.   Patient with epigastric right upper quad abdominal pain that radiates into the lower part of the chest and up to the right shoulder area.  Initial workup here from triage included cardiac workup.  Initial troponin was normal at 9.  Very reassuring because this pain has been constant.  Delta troponin pending.  Correction delta troponin is back and that is also 9 so completely unchanged.  Mild leukocytosis with a white count of 13,000 hemoglobin 16.8.  Basic metabolic panel without any acute abnormalities but sugar up at 153.  Chest x-ray no active cardiopulmonary disease.  And EKG without prior one for comparison but no acute changes.  Clinically suspicious for symptomatic cholelithiasis.  Will add on liver function test and lipase.  And get an ultrasound of the right upper quadrant.  Will treat with IV fluids pain medicine and antinausea medicine.  Ultrasound shows evidence of cholelithiasis.  Little bit of mild gallbladder wall thickening.  Common bile duct at 8 mm a little upper limits of normal.  LFTs are very normal lipase is normal.  Total bili normal at 1.2.  Indirect bilirubin up a little bit at 1.0.  Patient remains nontender right upper quadrant epigastric area patient's pain is completely resolved now.  Patient can be discharged home follow-up with general surgery patient made aware that this will continue to be a problem.  Should follow-up with general surgery.  For consideration of gallbladder removal.  Cardiac workup negative no concerns for cardiac troponins x 2 normal.  Discharge home with short course of hydrocodone.  Will recommend clear liquid diet for the next 48 hours and then a low-fat diet.  And precautions on when to return.    Final Clinical Impression(s) / ED Diagnoses Final diagnoses:  Right upper quadrant abdominal pain    Rx / DC Orders ED Discharge Orders     None         Fredia Sorrow, MD 06/05/22 1733    Fredia Sorrow,  MD 06/05/22 2009

## 2022-06-05 NOTE — Discharge Instructions (Addendum)
Recommend clear liquid diet for the next 48 hours and then low-fat diet.  Make an appointment follow-up with general surgery for consideration for removal of your gallbladder.  Return for fevers persistent vomiting worse pain.  Short course of pain medication provided.

## 2022-06-05 NOTE — ED Notes (Signed)
US at bedside

## 2022-06-05 NOTE — ED Notes (Signed)
Lab notified to add-on lipase and HFP lab work to previously collected blood work.

## 2022-06-05 NOTE — ED Notes (Signed)
Pt reports increased CP 6/10 and nausea.  Placed IV and drew second troponin.  Pt is in triage room and will be moved to next bed

## 2022-06-05 NOTE — ED Notes (Signed)
D/c paperwork reviewed with pt, including prescription and f/u care. Pt verbalized understanding, no questions or concerns at time of d/c.  Ambulatory to ED exit without assistance, NAD>
